# Patient Record
Sex: Male | Born: 1990 | Race: Black or African American | Hispanic: No | Marital: Single
Health system: Southern US, Community
[De-identification: ages and names within clinical notes are randomized; demographics above are authoritative.]

---
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
## 2011-09-18 LAB — HEPATIC FUNCTION PANEL
Albumin: 3.9
Indirect Bilirubin: 1 — ABNORMAL HIGH
Total Protein: 7.1

## 2011-09-18 LAB — GAMMA GT: GGT: 34

## 2011-09-18 LAB — TSH: TSH: 1.842

## 2011-09-18 LAB — T4, FREE: Free T4: 1.29

## 2018-03-04 ENCOUNTER — Ambulatory Visit (HOSPITAL_COMMUNITY)
Admission: RE | Admit: 2018-03-04 | Discharge: 2018-03-04 | Disposition: A | Discharge status: Home / Self Care | Payer: Self-pay | Source: Ambulatory Visit | Attending: Occupational Medicine | Admitting: Occupational Medicine

## 2018-03-04 ENCOUNTER — Other Ambulatory Visit: Payer: Self-pay | Admitting: Occupational Medicine

## 2018-03-04 DIAGNOSIS — R7612 Nonspecific reaction to cell mediated immunity measurement of gamma interferon antigen response without active tuberculosis: Secondary | ICD-10-CM

## 2018-08-27 IMAGING — DX DG CHEST 2V
2 series · 2 of 2 positions shown · non-contrast
Comparison: None.

CLINICAL DATA: Positive TB test

EXAM:
CHEST - 2 VIEW

[chest pa]
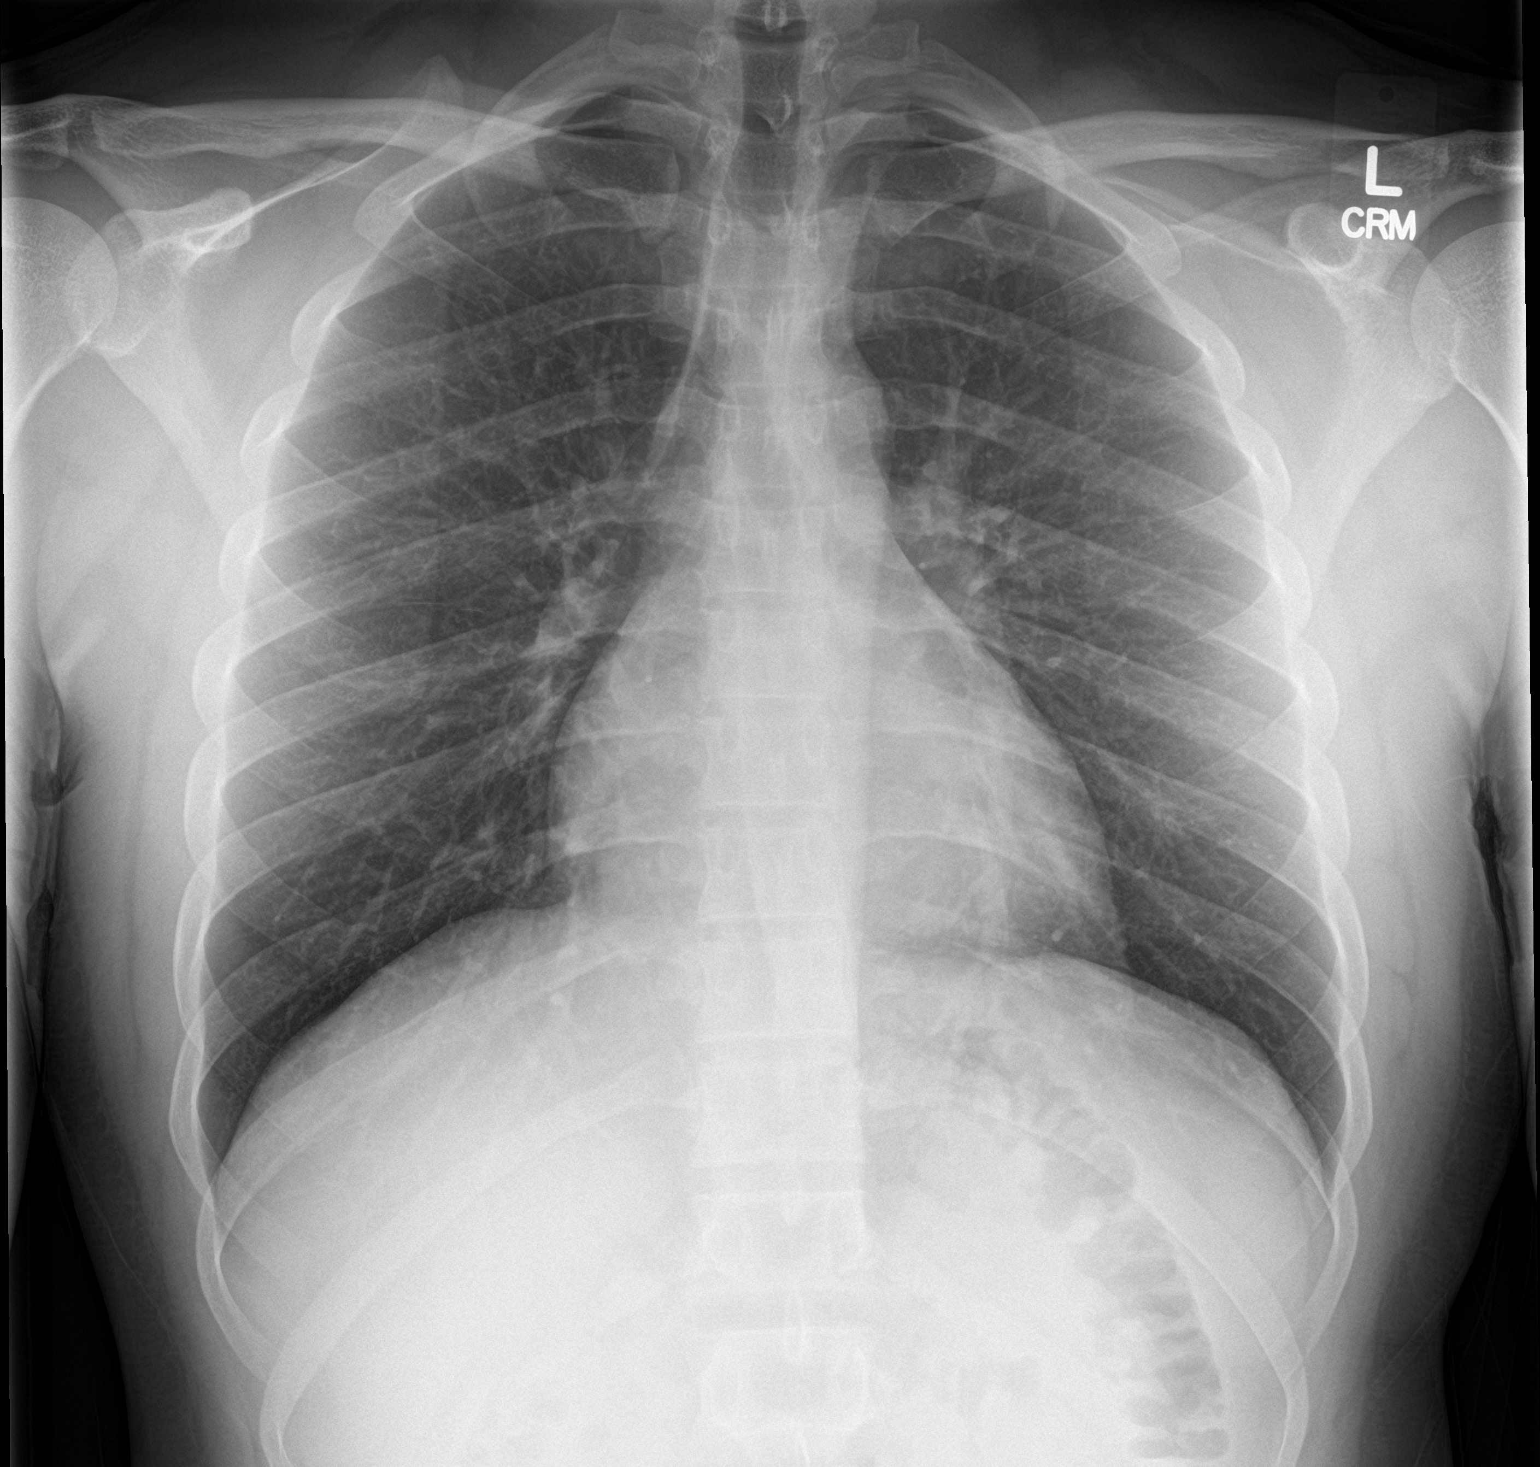

[chest lat]
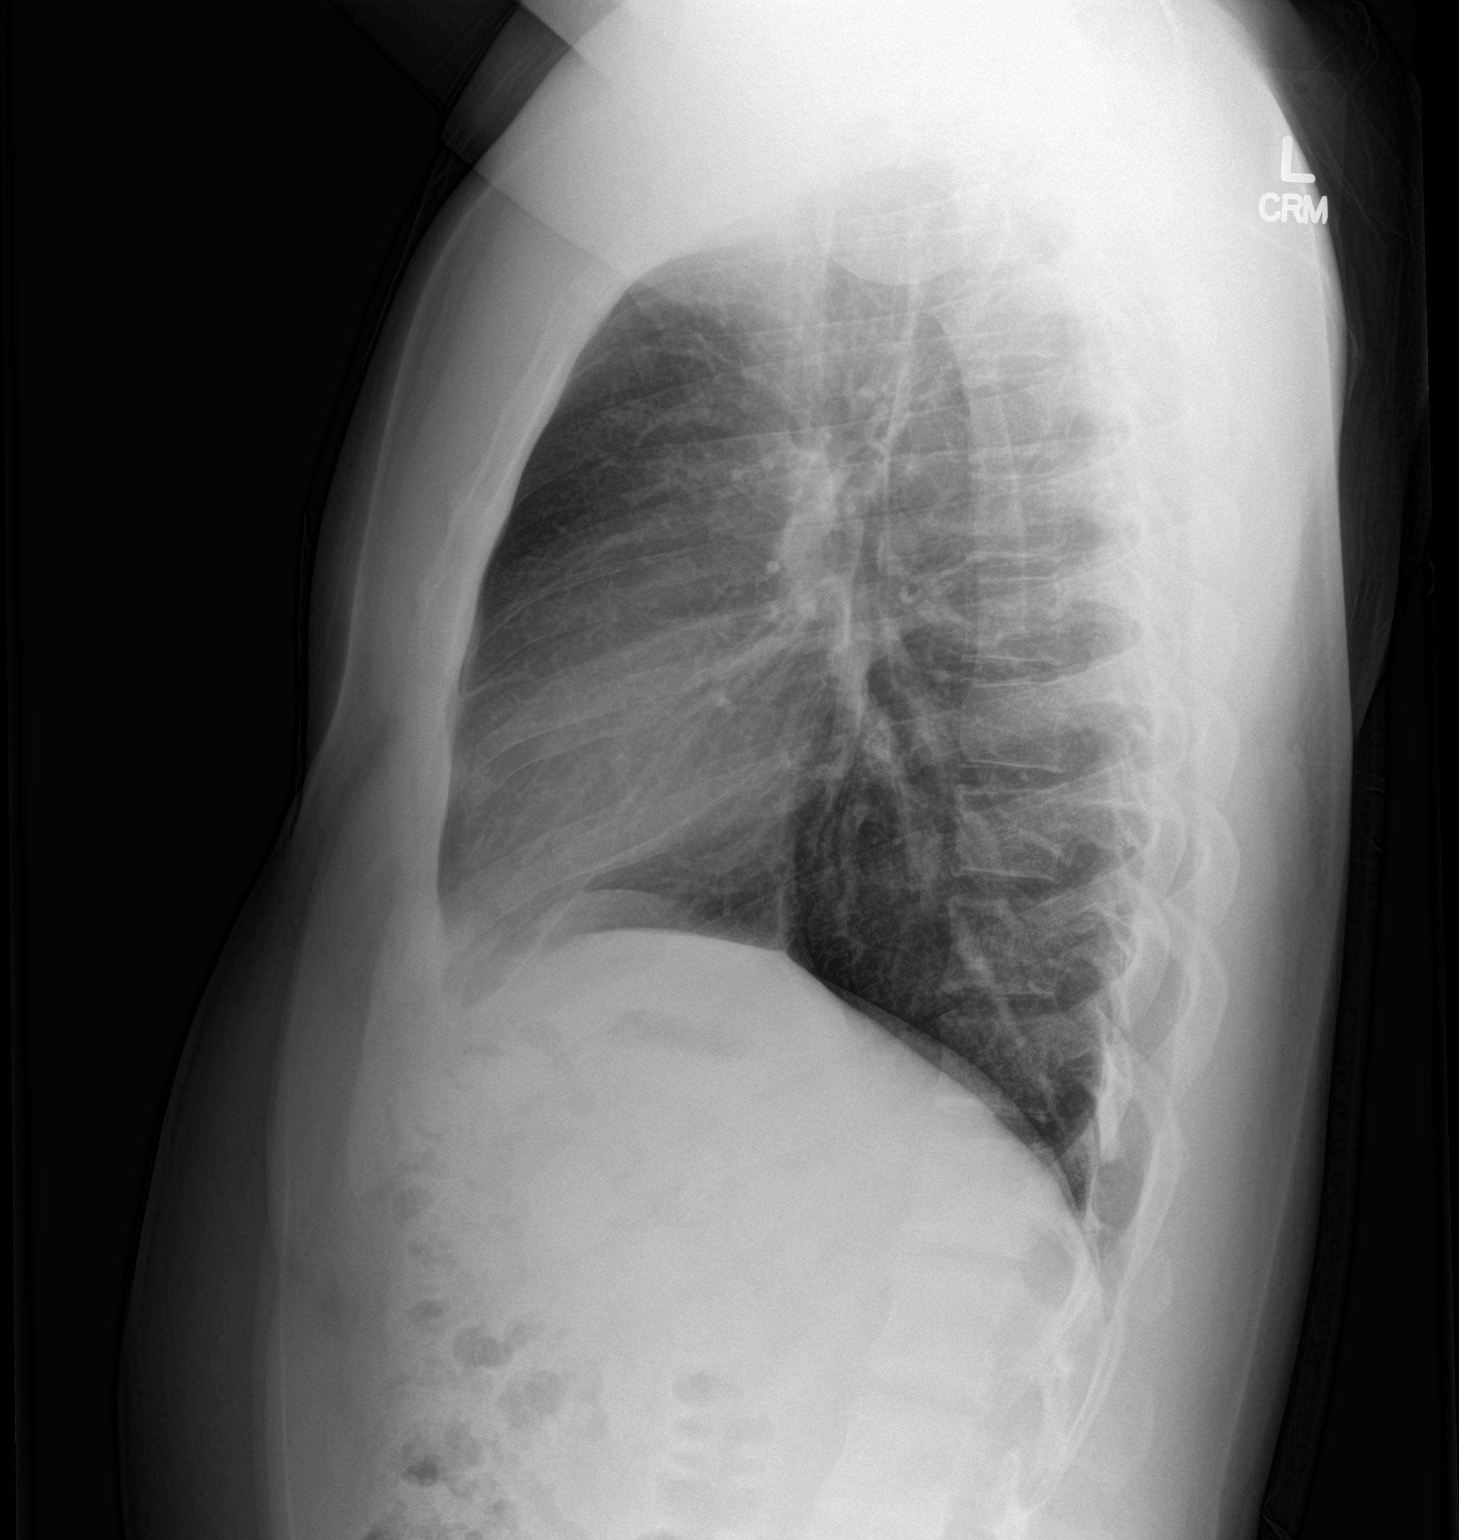

[2 of 2 positions shown; findings below may reference images not displayed]

FINDINGS: The heart size and mediastinal contours are within normal limits.
Both lungs are clear. The visualized skeletal structures are
unremarkable.
IMPRESSION: No active cardiopulmonary disease.

## 2019-12-11 ENCOUNTER — Ambulatory Visit: Payer: Self-pay

## 2019-12-11 NOTE — Telephone Encounter (Signed)
Pt stated he had test done in mid December and in November. No results are listed. Pt requested appt. Appt made.

## 2019-12-13 ENCOUNTER — Ambulatory Visit: Payer: Self-pay | Attending: Internal Medicine

## 2019-12-13 DIAGNOSIS — Z20822 Contact with and (suspected) exposure to covid-19: Secondary | ICD-10-CM | POA: Insufficient documentation

## 2019-12-14 LAB — NOVEL CORONAVIRUS, NAA: SARS-CoV-2, NAA: NOT DETECTED
